# Patient Record
Sex: Female | Born: 2000 | Race: White | Hispanic: No | Marital: Single | Smoking: Never smoker
Health system: Southern US, Community
[De-identification: ages and names within clinical notes are randomized; demographics above are authoritative.]

---
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
## 2020-01-11 IMAGING — CR RIGHT HAND - COMPLETE 3+ VIEW
3 series · 3 of 3 positions shown · non-contrast
Comparison: None.

CLINICAL DATA: Punched a wall.

EXAM:
RIGHT HAND - COMPLETE 3+ VIEW

[x hand pa right]
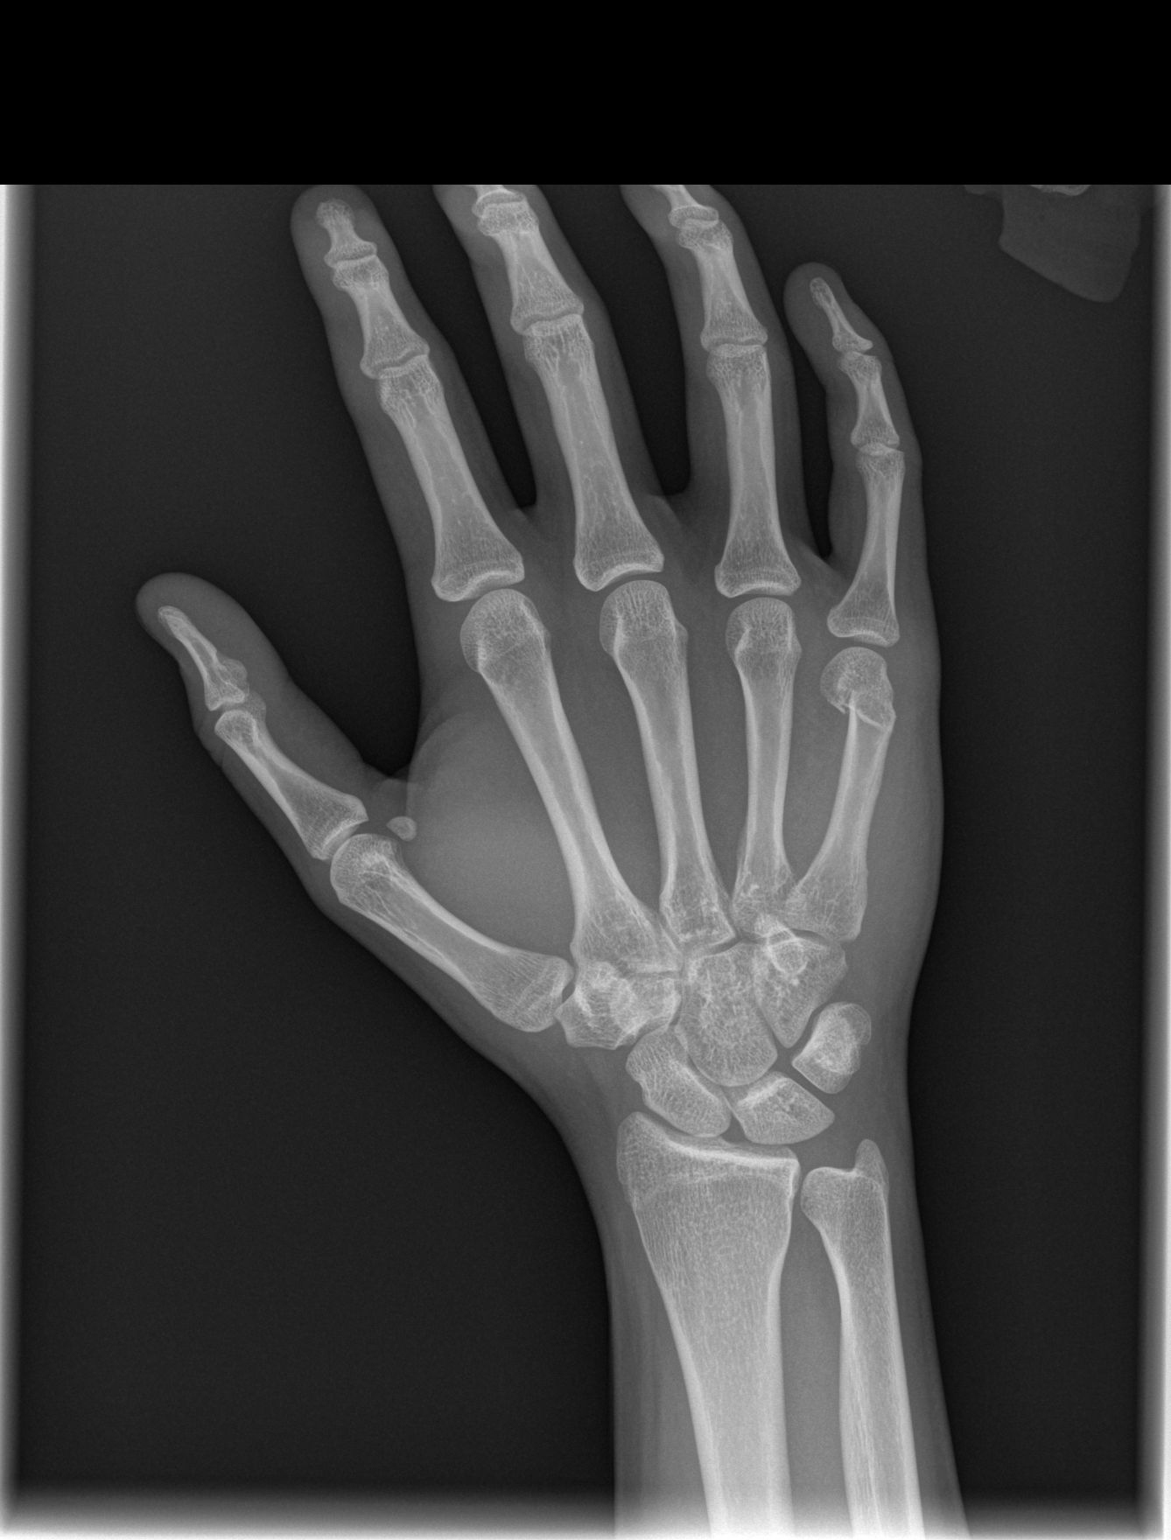

[x hand oblique right]
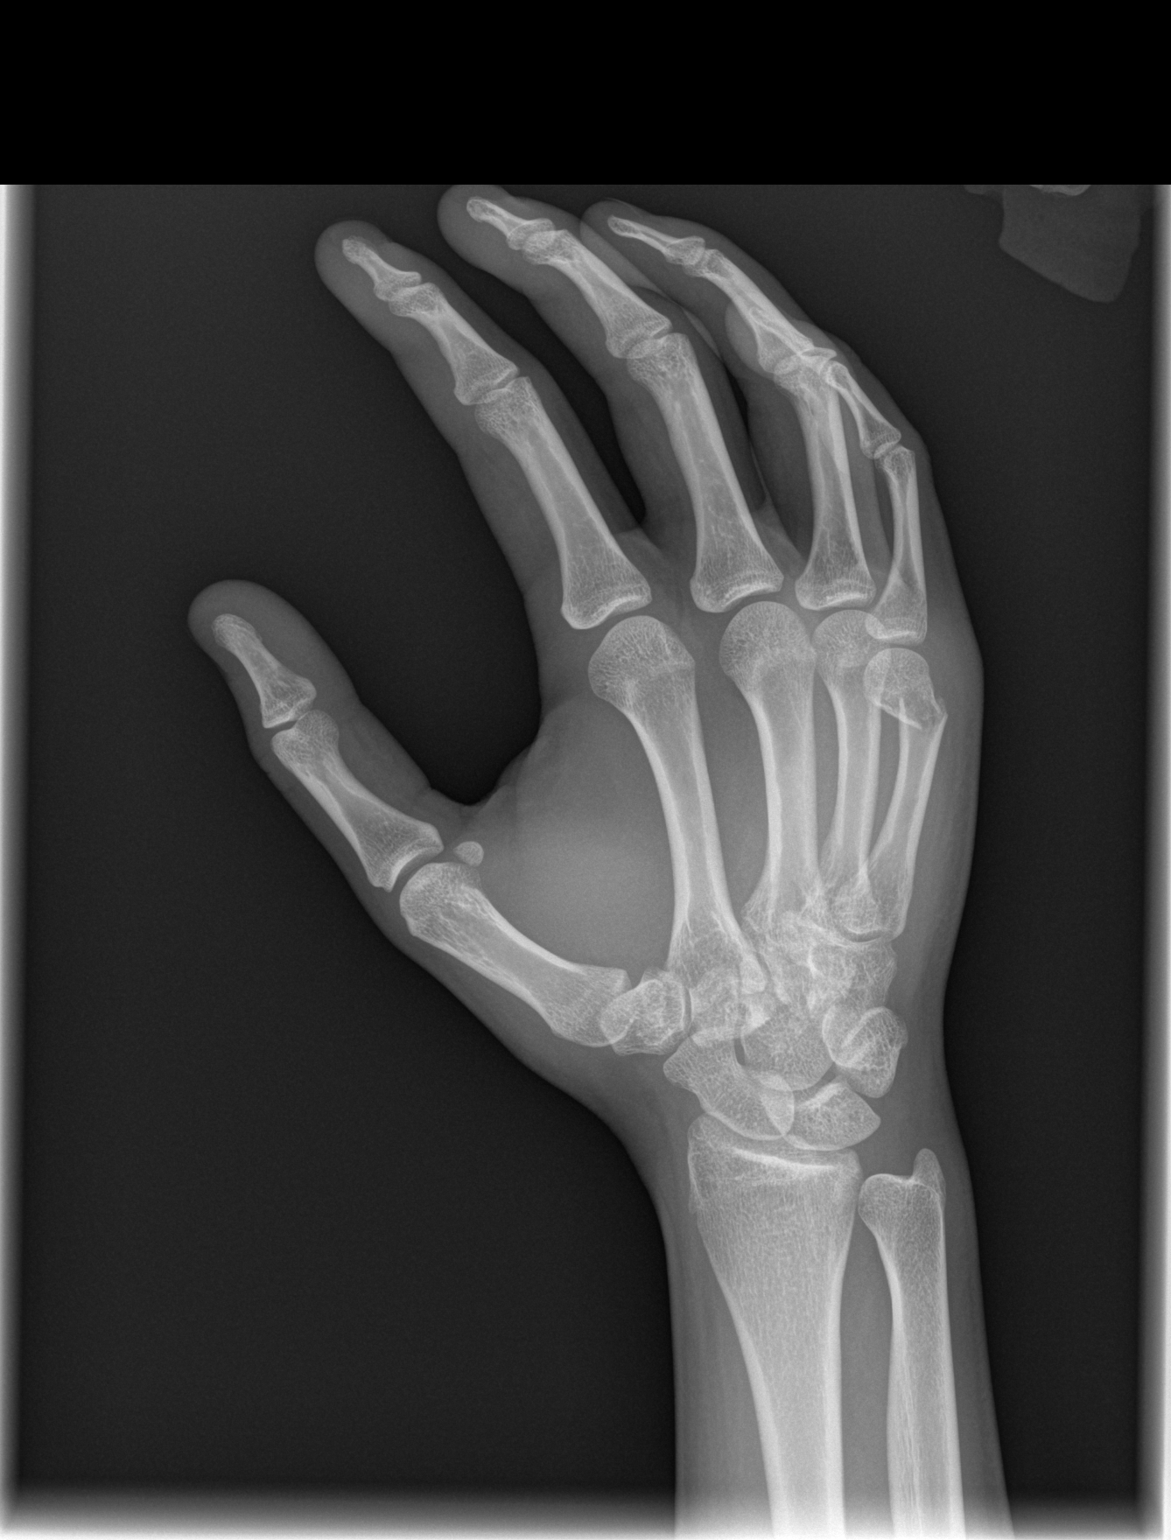

[x hand lat right]
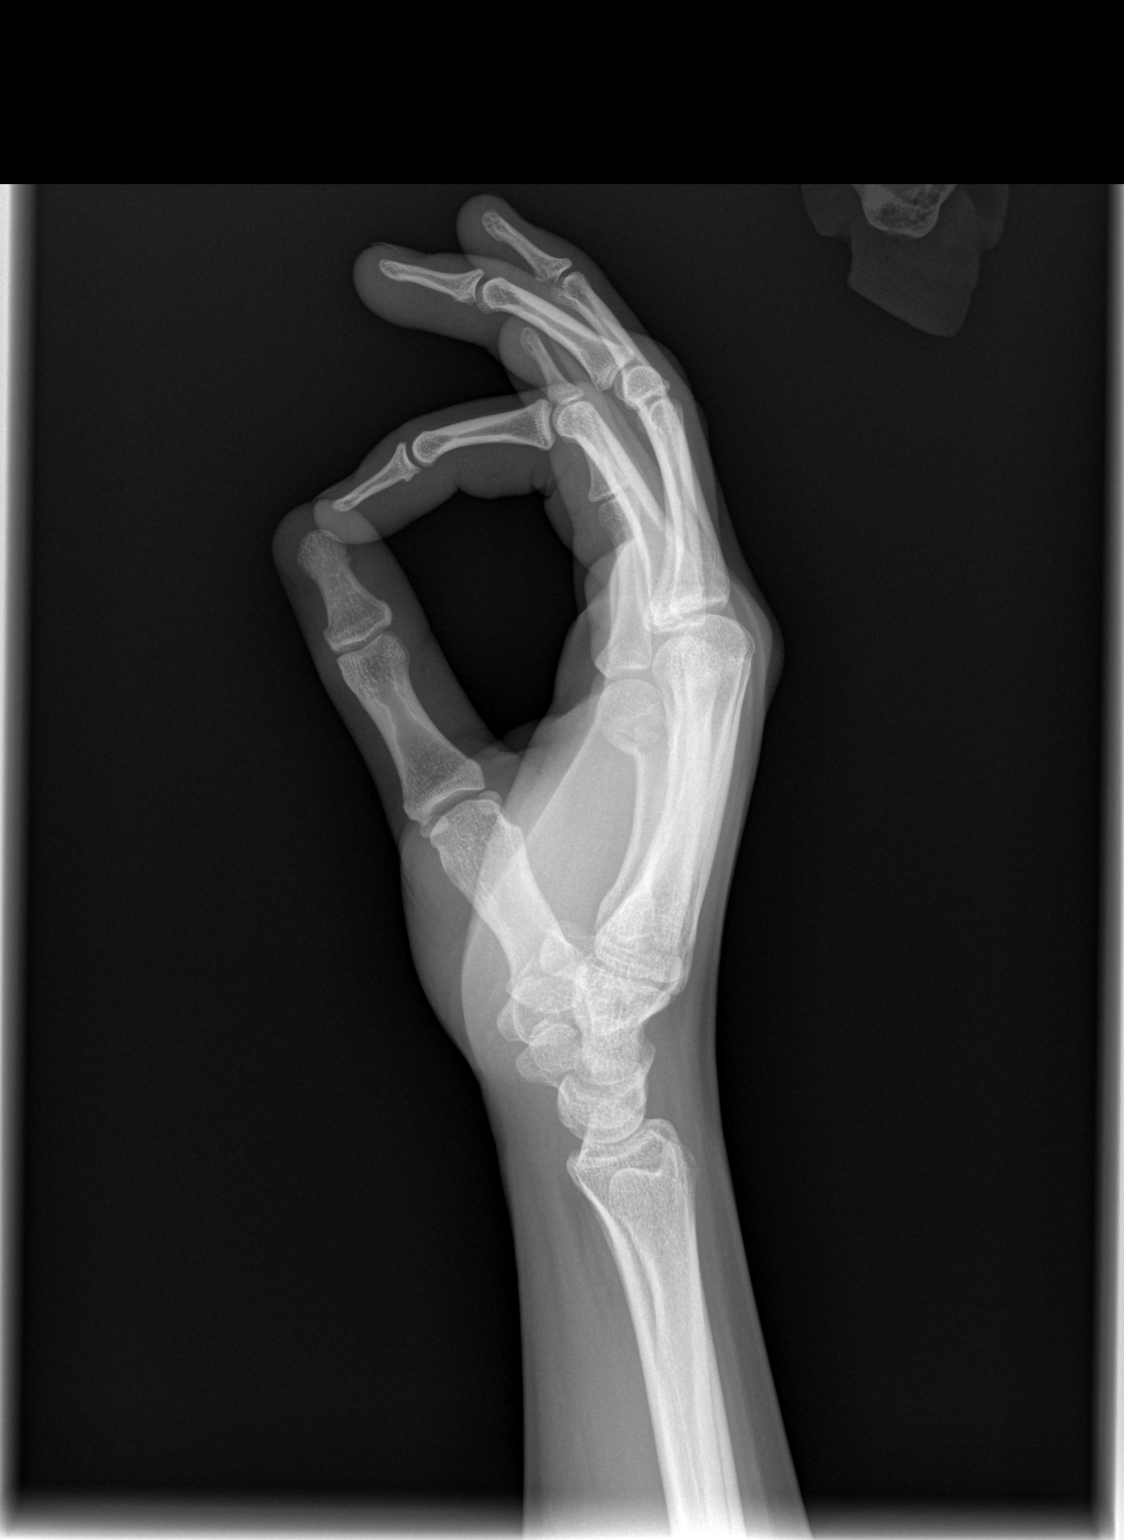

[3 of 3 positions shown; findings below may reference images not displayed]

FINDINGS: Acute impacted fracture of the fifth metacarpal neck with volar
angulation. Adjacent soft tissue swelling. No additional fracture.
No dislocation. Joint spaces are preserved. Bone mineralization is
normal.
IMPRESSION: 1. Acute angulated and impacted fracture of the fifth metacarpal
neck.
